# Patient Record
Sex: Male | Born: 1985 | Hispanic: Yes | Marital: Single | Smoking: Never smoker
Health system: Southern US, Community
[De-identification: ages and names within clinical notes are randomized; demographics above are authoritative.]

---

## 2014-02-23 ENCOUNTER — Emergency Department (HOSPITAL_COMMUNITY)
Admission: EM | Admit: 2014-02-23 | Discharge: 2014-02-23 | Disposition: A | Discharge status: Home / Self Care | Payer: Self-pay | Attending: Emergency Medicine | Admitting: Emergency Medicine

## 2014-02-23 ENCOUNTER — Emergency Department (HOSPITAL_COMMUNITY): Payer: Self-pay

## 2014-02-23 ENCOUNTER — Encounter (HOSPITAL_COMMUNITY): Payer: Self-pay

## 2014-02-23 DIAGNOSIS — R1032 Left lower quadrant pain: Secondary | ICD-10-CM | POA: Insufficient documentation

## 2014-02-23 LAB — COMPREHENSIVE METABOLIC PANEL
ALBUMIN: 4.2 g/dL (ref 3.5–5.2)
ALK PHOS: 114 U/L (ref 39–117)
ALT: 32 U/L (ref 0–53)
AST: 25 U/L (ref 0–37)
Anion gap: 12 (ref 5–15)
BUN: 9 mg/dL (ref 6–23)
CALCIUM: 9.5 mg/dL (ref 8.4–10.5)
CO2: 28 mEq/L (ref 19–32)
CREATININE: 0.76 mg/dL (ref 0.50–1.35)
Chloride: 99 mEq/L (ref 96–112)
GFR calc non Af Amer: >90 mL/min (ref >90)
GLUCOSE: 102 mg/dL — AB (ref 70–99)
POTASSIUM: 3.8 meq/L (ref 3.7–5.3)
Sodium: 139 mEq/L (ref 137–147)
TOTAL PROTEIN: 7.8 g/dL (ref 6.0–8.3)
Total Bilirubin: 0.4 mg/dL (ref 0.3–1.2)

## 2014-02-23 LAB — CBC WITH DIFFERENTIAL/PLATELET
BASOS PCT: 1 % (ref 0–1)
Basophils Absolute: 0 10*3/uL (ref 0.0–0.1)
Eosinophils Absolute: 0.1 10*3/uL (ref 0.0–0.7)
Eosinophils Relative: 2 % (ref 0–5)
HEMATOCRIT: 47.7 % (ref 39.0–52.0)
Hemoglobin: 16.6 g/dL (ref 13.0–17.0)
LYMPHS ABS: 1.4 10*3/uL (ref 0.7–4.0)
LYMPHS PCT: 21 % (ref 12–46)
MCH: 29.4 pg (ref 26.0–34.0)
MCHC: 34.8 g/dL (ref 30.0–36.0)
MCV: 84.4 fL (ref 78.0–100.0)
MONO ABS: 0.5 10*3/uL (ref 0.1–1.0)
MONOS PCT: 8 % (ref 3–12)
Neutro Abs: 4.5 10*3/uL (ref 1.7–7.7)
Neutrophils Relative %: 68 % (ref 43–77)
Platelets: 278 10*3/uL (ref 150–400)
RBC: 5.65 MIL/uL (ref 4.22–5.81)
RDW: 12.1 % (ref 11.5–15.5)
WBC: 6.5 10*3/uL (ref 4.0–10.5)

## 2014-02-23 MED ORDER — IOHEXOL 300 MG/ML  SOLN
25.0000 mL | Freq: Once | INTRAMUSCULAR | Status: AC | PRN
  Administered 2014-02-23: 25 mL via ORAL

## 2014-02-23 MED ORDER — TRAMADOL HCL 50 MG PO TABS: 50.0000 mg | ORAL_TABLET | Freq: Four times a day (QID) | ORAL | Status: AC | PRN

## 2014-02-23 NOTE — Discharge Instructions (Signed)
Como hablemos, su dolor no parece peligroso.  Tus examenes son normal

## 2014-02-23 NOTE — ED Notes (Signed)
Pt here for LLQ pain off and on and a bulging he feels occasionally. States he does heavy lifting. Pt speaks spanish. No N/V/D.

## 2014-02-23 NOTE — ED Provider Notes (Signed)
CSN: [id number]     Arrival date & time 02/23/14  1047 History   First MD Initiated Contact with Patient 02/23/14 1226     Chief Complaint  Patient presents with  . Hernia      HPI  Patient presents with concern of increasing pain in the left lower quadrant.  Pain began within the past few months, has become progressively more severe each day.  Pain is worse with exertion or bending.  Patient feels as though there is a palpable mass in the left lower quadrant with exertion. Patient denies concurrent fevers, chills, does endorse nausea, with near emesis. Relief comes with rest. Patient has no surgical history, no medical history, takes no medication, does not smoke.  History reviewed. No pertinent past medical history. History reviewed. No pertinent past surgical history. History reviewed. No pertinent family history. History  Substance Use Topics  . Smoking status: Never Smoker   . Smokeless tobacco: Not on file  . Alcohol Use: Yes     Comment: occ    Review of Systems  Constitutional:       Per HPI, otherwise negative  HENT:       Per HPI, otherwise negative  Respiratory:       Per HPI, otherwise negative  Cardiovascular:       Per HPI, otherwise negative  Gastrointestinal: Negative for vomiting.  Endocrine:       Negative aside from HPI  Genitourinary:       Neg aside from HPI   Musculoskeletal:       Per HPI, otherwise negative  Skin: Negative.   Neurological: Negative for syncope.      Allergies  Review of patient's allergies indicates no known allergies.  Home Medications   Prior to Admission medications   Medication Sig Start Date End Date Taking? Authorizing Provider  traMADol (ULTRAM) 50 MG tablet Take 1 tablet (50 mg total) by mouth every 6 (six) hours as needed for severe pain. 02/23/14   Gerhard Munchobert Ekin Pilar, MD   BP 123/100 mmHg  Pulse 92  Temp(Src) 97.8 F (36.6 C) (Oral)  Resp 20  Wt 125 lb (56.7 kg)  SpO2 100% Physical Exam  Constitutional:  He is oriented to person, place, and time. He appears well-developed. No distress.  HENT:  Head: Normocephalic and atraumatic.  Eyes: Conjunctivae and EOM are normal.  Cardiovascular: Normal rate and regular rhythm.   Pulmonary/Chest: Effort normal. No stridor. No respiratory distress.  Abdominal: He exhibits no distension. There is no hepatosplenomegaly. There is tenderness in the left lower quadrant. There is guarding. There is no rigidity and no rebound.  Musculoskeletal: He exhibits no edema.  Neurological: He is alert and oriented to person, place, and time.  Skin: Skin is warm and dry.  Psychiatric: He has a normal mood and affect.  Nursing note and vitals reviewed.   ED Course  Procedures (including critical care time) Labs Review Labs Reviewed  COMPREHENSIVE METABOLIC PANEL - Abnormal; Notable for the following:    Glucose, Bld 102 (*)    All other components within normal limits  CBC WITH DIFFERENTIAL    Imaging Review Ct Abdomen Pelvis Wo Contrast  02/23/2014   CLINICAL DATA:  Left lower quadrant abdominal pain and nausea.  EXAM: CT ABDOMEN AND PELVIS WITHOUT CONTRAST  TECHNIQUE: Multidetector CT imaging of the abdomen and pelvis was performed following the standard protocol without IV contrast.  COMPARISON:  None.  FINDINGS: The lung bases are clear without focal nodule, mass, or  airspace disease. The heart size is normal. No significant pleural or pericardial effusion is present.  The liver and spleen are within normal limits. The stomach, duodenum, and pancreas are unremarkable. The common bile duct and gallbladder are normal. The adrenal glands are normal bilaterally. The kidney is an ureters are within normal limits.  The rectosigmoid colon is within normal limits. There is no significant diverticular disease or inflammation. The remainder the colon is unremarkable. The appendix is visualized and normal. Contrast can be seen into the distal small bowel but not to the  ileocecal junction. The small bowel is unremarkable otherwise. No significant adenopathy or free fluid is present.  The bone windows are unremarkable. The urinary bladder is mildly distended.  IMPRESSION: No acute or focal lesion to explain the patient's symptoms. Normal CT of the abdomen and pelvis.   Electronically Signed   By: Gennette Pachris  Mattern M.D.   On: 02/23/2014 13:57    3:33 PM Patient in no distress. He is aware of all results.  I discussed his case w CM to arrange outpatient f/u.   MDM   Final diagnoses:  Left lower quadrant pain    Patient presents with intermittent left lower quadrant abdominal pain, sore, severe, and given his description of worsening pain with exertion, there is some suspicion for hernia. Patient has no primary care access. Patient had CT scan to rule out incarceration, or other acute pathology.  Patient's CT scan, labs were reassuring.  Patient was discharged in stable condition with no ongoing pain to follow-up with a primary care physician.    Gerhard Munchobert Yaasir Menken, MD 02/23/14 [phone]

## 2014-03-04 ENCOUNTER — Ambulatory Visit: Payer: Self-pay | Admitting: Internal Medicine

## 2015-03-18 IMAGING — CT CT ABD-PELV W/O CM
2 of 4 series · 16 of 46 positions shown, 18 images · non-contrast
Comparison: None.

CLINICAL DATA: Left lower quadrant abdominal pain and nausea.

EXAM:
CT ABDOMEN AND PELVIS WITHOUT CONTRAST
TECHNIQUE: Multidetector CT imaging of the abdomen and pelvis was performed
following the standard protocol without IV contrast.

[Series 2: abd/ pelvis 5.0 i30f 1 · axial · 0.63mm/px · z∈[+744,+1174]mm · 13 of 94 slices shown, 15 images]
[im 4/94  soft-tissue]
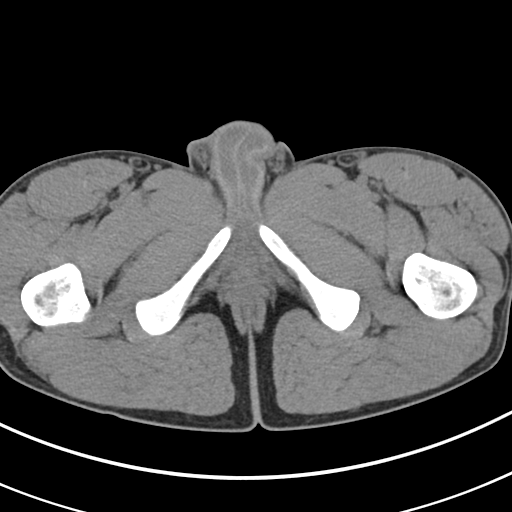
[im 4/94  bone]
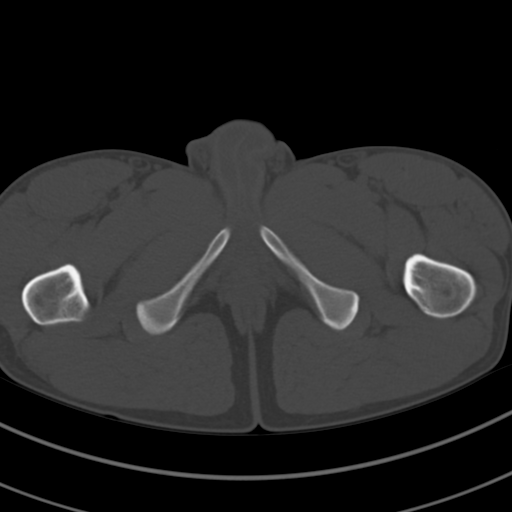
[im 12/94  soft-tissue]
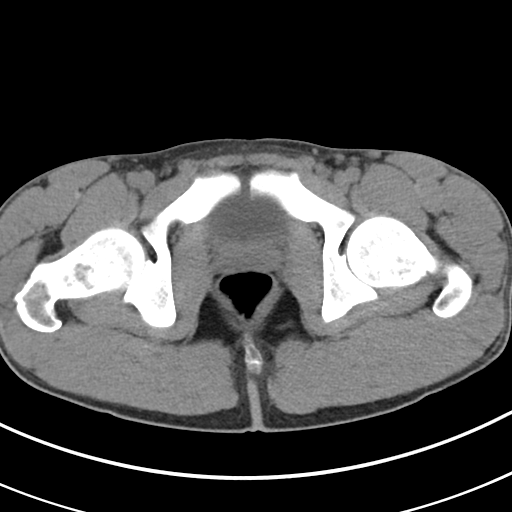
[im 19/94  soft-tissue]
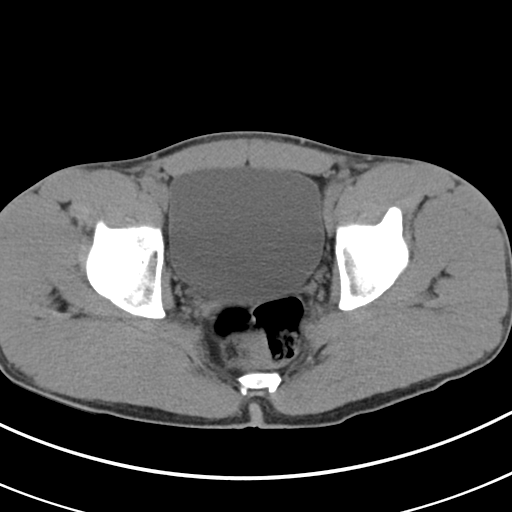
[im 27/94  soft-tissue]
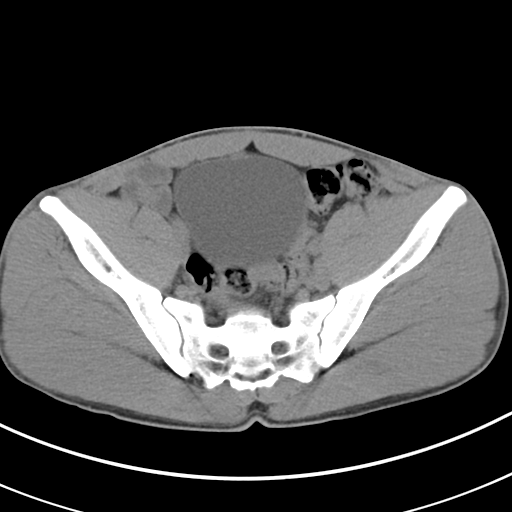
[im 34/94  soft-tissue]
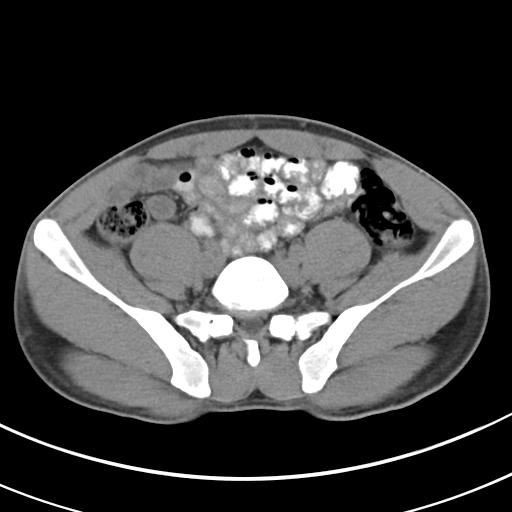
[im 41/94  soft-tissue]
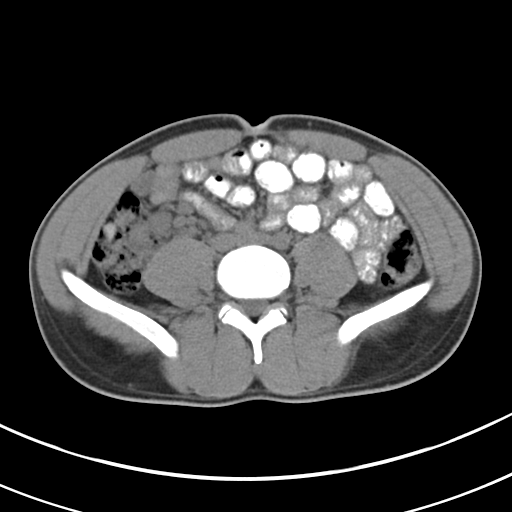
[im 49/94  soft-tissue]
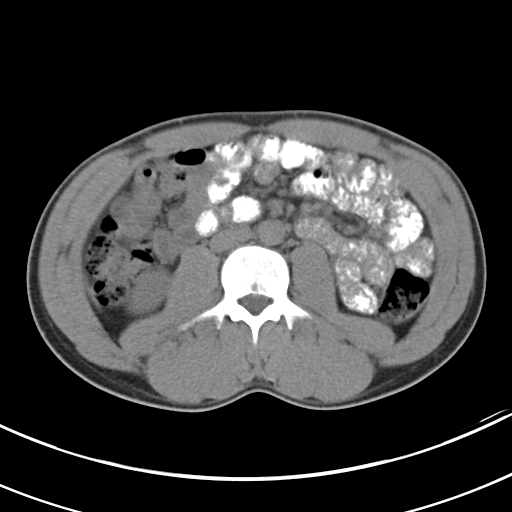
[im 53/94  soft-tissue]
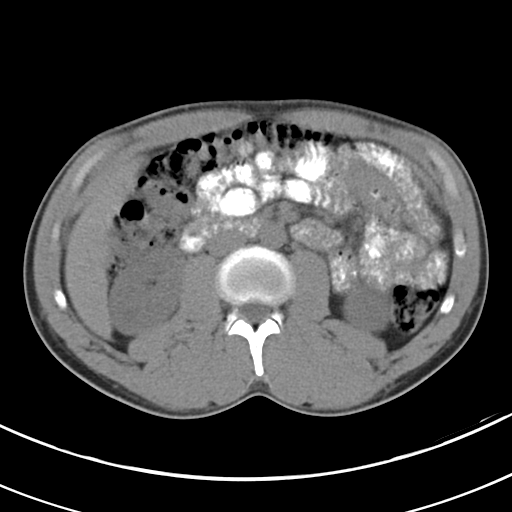
[im 60/94  soft-tissue]
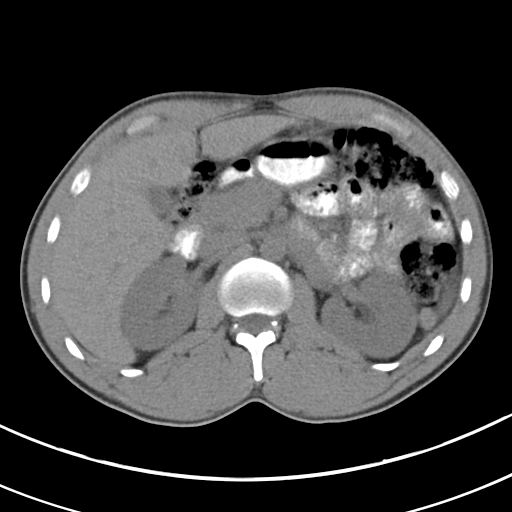
[im 60/94  bone]
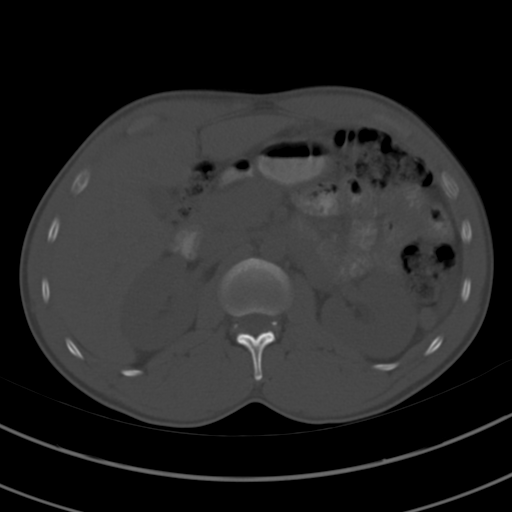
[im 67/94  soft-tissue]
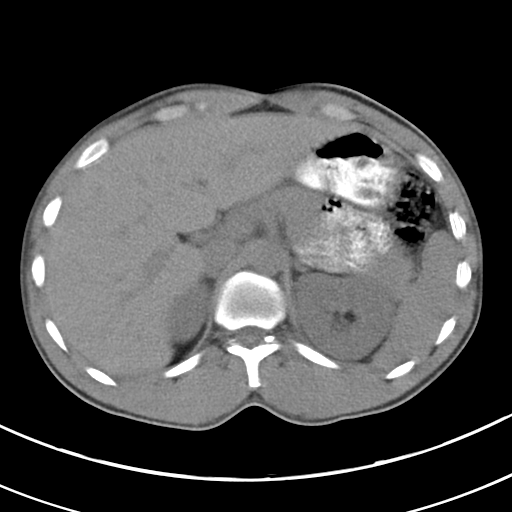
[im 75/94  soft-tissue]
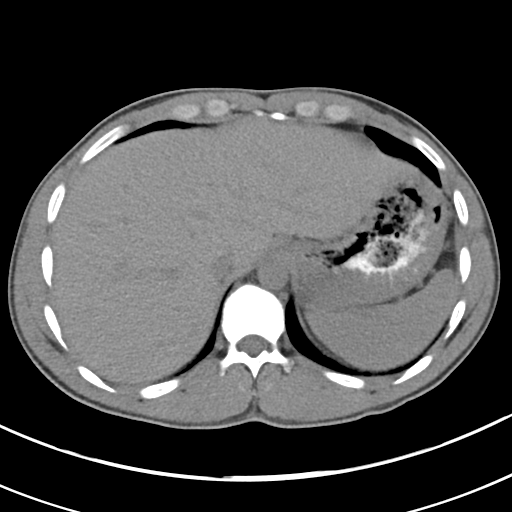
[im 82/94  soft-tissue]
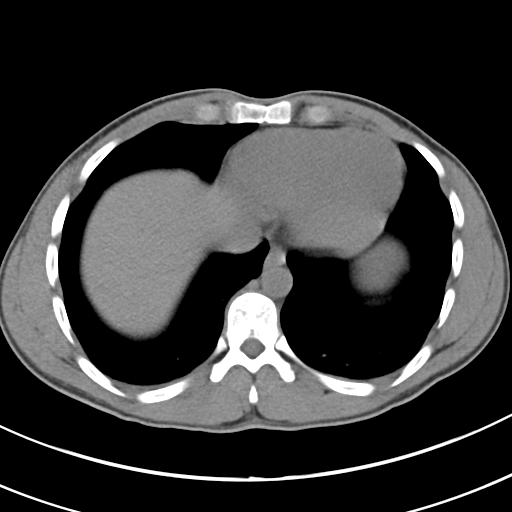
[im 90/94  soft-tissue]
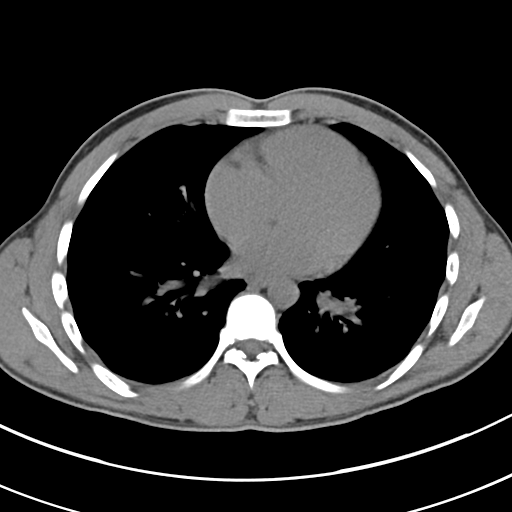

[Series 5: coronals · coronal · 0.70mm/px · 3 of 160 slices shown]
[im 54/160  soft-tissue]
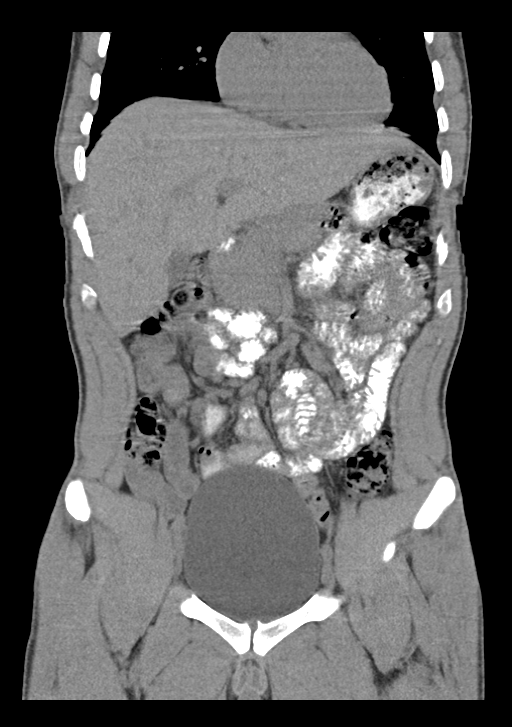
[im 71/160  soft-tissue]
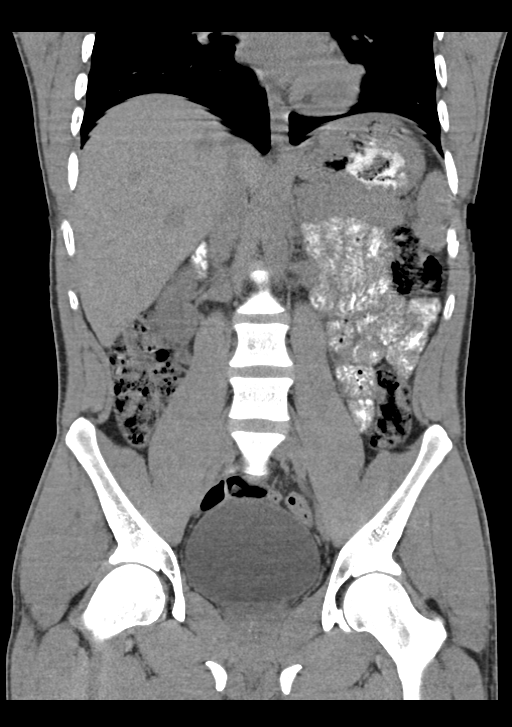
[im 89/160  soft-tissue]
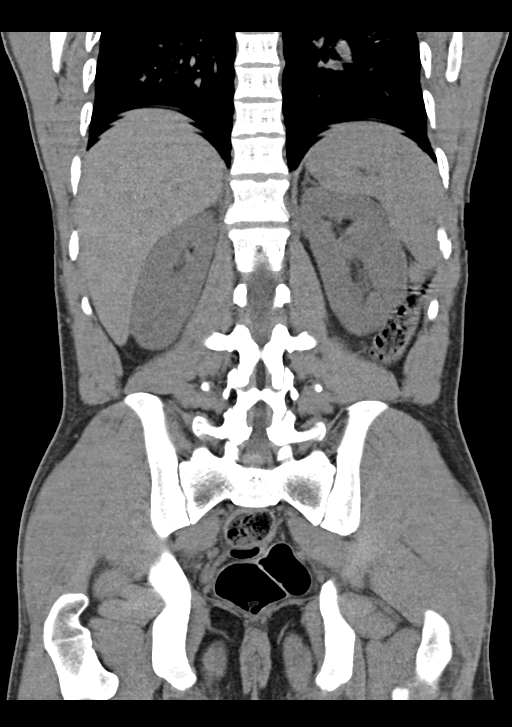

[16 of 46 positions shown; findings below may reference images not displayed]

FINDINGS: The lung bases are clear without focal nodule, mass, or airspace
disease. The heart size is normal. No significant pleural or
pericardial effusion is present.

The liver and spleen are within normal limits. The stomach,
duodenum, and pancreas are unremarkable. The common bile duct and
gallbladder are normal. The adrenal glands are normal bilaterally.
The kidney is an ureters are within normal limits.

The rectosigmoid colon is within normal limits. There is no
significant diverticular disease or inflammation. The remainder the
colon is unremarkable. The appendix is visualized and normal.
Contrast can be seen into the distal small bowel but not to the
ileocecal junction. The small bowel is unremarkable otherwise. No
significant adenopathy or free fluid is present.

The bone windows are unremarkable. The urinary bladder is mildly
distended.
IMPRESSION: No acute or focal lesion to explain the patient's symptoms. Normal
CT of the abdomen and pelvis.
